# Patient Record
Sex: Female | Born: 1975 | Race: White | Hispanic: No | Marital: Married | Smoking: Never smoker
Health system: Southern US, Community
[De-identification: ages and names within clinical notes are randomized; demographics above are authoritative.]

## PROBLEM LIST (undated history)

## (undated) DIAGNOSIS — K5900 Constipation, unspecified: Secondary | ICD-10-CM

## (undated) HISTORY — PX: APPENDECTOMY: SHX54

## (undated) HISTORY — PX: STAPEDECTOMY: SHX2435

## (undated) HISTORY — PX: BREAST REDUCTION SURGERY: SHX8

---

## 1999-11-29 HISTORY — PX: REDUCTION MAMMAPLASTY: SUR839

## 2015-12-27 ENCOUNTER — Ambulatory Visit
Admission: EM | Admit: 2015-12-27 | Discharge: 2015-12-27 | Disposition: A | Discharge status: Home / Self Care | Payer: No Typology Code available for payment source | Attending: Family Medicine | Admitting: Family Medicine

## 2015-12-27 DIAGNOSIS — N390 Urinary tract infection, site not specified: Secondary | ICD-10-CM

## 2015-12-27 HISTORY — DX: Constipation, unspecified: K59.00

## 2015-12-27 LAB — URINALYSIS COMPLETE WITH MICROSCOPIC (ARMC ONLY)

## 2015-12-27 MED ORDER — CIPROFLOXACIN HCL 500 MG PO TABS: 500.0000 mg | ORAL_TABLET | Freq: Two times a day (BID) | ORAL | Status: AC

## 2015-12-27 MED ORDER — CIPROFLOXACIN HCL 500 MG PO TABS: 500.0000 mg | ORAL_TABLET | Freq: Two times a day (BID) | ORAL | Status: DC

## 2015-12-27 NOTE — ED Provider Notes (Signed)
CSN: [id number]     Arrival date & time 12/27/15  1131 History   First MD Initiated Contact with Patient 12/27/15 1247    Nurses notes were reviewed. Chief Complaint  Patient presents with  . Urinary Tract Infection   Patient reports started have frequency last night. The frequency has continued. She reports some burning but mostly just almost irritation because she is unable to urinate even though she finds she has to. She states then passed from the burning will occur. She has Azo from the last UTI was she's been using which is interfering with her urinary test. She states14 days of antibiotics to treat her UTIs. She denies use infections denies any dyspareunia or vaginal discharge. No abdominal pain and just recently some right flank pain started to develop.   (Consider location/radiation/quality/duration/timing/severity/associated sxs/prior Treatment) Patient is a 40 y.o. female presenting with urinary tract infection. The history is provided by the patient.  Urinary Tract Infection Pain quality:  Burning Pain severity:  Mild Onset quality:  Sudden Chronicity:  New Recent urinary tract infections: no   Relieved by:  Phenazopyridine Ineffective treatments:  Phenazopyridine Urinary symptoms: frequent urination   Urinary symptoms: no hematuria and no bladder incontinence   Associated symptoms: flank pain   Associated symptoms: no abdominal pain, no fever, no genital lesions, no vaginal discharge and no vomiting     Past Medical History  Diagnosis Date  . Constipation    Past Surgical History  Procedure Laterality Date  . Appendectomy    . Breast reduction surgery    . Stapedectomy     No family history on file. Social History  Substance Use Topics  . Smoking status: Never Smoker   . Smokeless tobacco: None  . Alcohol Use: Yes     Comment: socially   OB History    No data available     Review of Systems  Constitutional: Negative for fever.  Gastrointestinal: Negative  for vomiting and abdominal pain.  Genitourinary: Positive for flank pain. Negative for vaginal discharge.  All other systems reviewed and are negative.   Allergies  Sulfa antibiotics and Adhesive  Home Medications   Prior to Admission medications   Medication Sig Start Date End Date Taking? Authorizing Provider  GARCINIA CAMBOGIA-CHROMIUM PO Take by mouth.   Yes Historical Provider, MD  Oxycodone-Acetaminophen (LYNOX PO) Take by mouth.   Yes Historical Provider, MD  ciprofloxacin (CIPRO) 500 MG tablet Take 1 tablet (500 mg total) by mouth 2 (two) times daily. 12/27/15   Frederich Cha, MD   Meds Ordered and Administered this Visit  Medications - No data to display  BP 134/89 mmHg  Pulse 86  Temp(Src) 96.5 F (35.8 C) (Tympanic)  Resp 16  Ht 5' 4"  (1.626 m)  Wt 192 lb (87.091 kg)  BMI 32.94 kg/m2  SpO2 100% No data found.   Physical Exam  Constitutional: She is oriented to person, place, and time. She appears well-developed.  HENT:  Head: Normocephalic and atraumatic.  Eyes: Conjunctivae are normal. Pupils are equal, round, and reactive to light.  Neck: Normal range of motion.  Abdominal: Soft. Bowel sounds are normal. There is no tenderness.  Musculoskeletal: Normal range of motion.  Neurological: She is alert and oriented to person, place, and time.  Skin: Skin is warm and dry.  Psychiatric: She has a normal mood and affect.  Vitals reviewed.   ED Course  Procedures (including critical care time)  Labs Review Labs Reviewed  URINALYSIS COMPLETEWITH MICROSCOPIC [facility]  ONLY) - Abnormal; Notable for the following:    Color, Urine ORANGE (*)    APPearance HAZY (*)    Glucose, UA   (*)    Value: TEST NOT REPORTED DUE TO COLOR INTERFERENCE OF URINE PIGMENT   Bilirubin Urine   (*)    Value: TEST NOT REPORTED DUE TO COLOR INTERFERENCE OF URINE PIGMENT   Ketones, ur   (*)    Value: TEST NOT REPORTED DUE TO COLOR INTERFERENCE OF URINE PIGMENT   Hgb urine dipstick   (*)     Value: TEST NOT REPORTED DUE TO COLOR INTERFERENCE OF URINE PIGMENT   Protein, ur   (*)    Value: TEST NOT REPORTED DUE TO COLOR INTERFERENCE OF URINE PIGMENT   Nitrite   (*)    Value: TEST NOT REPORTED DUE TO COLOR INTERFERENCE OF URINE PIGMENT   Leukocytes, UA   (*)    Value: TEST NOT REPORTED DUE TO COLOR INTERFERENCE OF URINE PIGMENT   Bacteria, UA RARE (*)    Squamous Epithelial / LPF 0-5 (*)    All other components within normal limits  URINE CULTURE    Imaging Review No results found.   Visual Acuity Review  Right Eye Distance:   Left Eye Distance:   Bilateral Distance:    Right Eye Near:   Left Eye Near:    Bilateral Near:     Results for orders placed or performed during the hospital encounter of 12/27/15  Urinalysis complete, with microscopic  Result Value Ref Range   Color, Urine ORANGE (A) YELLOW   APPearance HAZY (A) CLEAR   Glucose, UA (A) NEGATIVE mg/dL    TEST NOT REPORTED DUE TO COLOR INTERFERENCE OF URINE PIGMENT   Bilirubin Urine (A) NEGATIVE    TEST NOT REPORTED DUE TO COLOR INTERFERENCE OF URINE PIGMENT   Ketones, ur (A) NEGATIVE mg/dL    TEST NOT REPORTED DUE TO COLOR INTERFERENCE OF URINE PIGMENT   Specific Gravity, Urine  1.005 - 1.030    TEST NOT REPORTED DUE TO COLOR INTERFERENCE OF URINE PIGMENT   Hgb urine dipstick (A) NEGATIVE    TEST NOT REPORTED DUE TO COLOR INTERFERENCE OF URINE PIGMENT   pH  5.0 - 8.0    TEST NOT REPORTED DUE TO COLOR INTERFERENCE OF URINE PIGMENT   Protein, ur (A) NEGATIVE mg/dL    TEST NOT REPORTED DUE TO COLOR INTERFERENCE OF URINE PIGMENT   Nitrite (A) NEGATIVE    TEST NOT REPORTED DUE TO COLOR INTERFERENCE OF URINE PIGMENT   Leukocytes, UA (A) NEGATIVE    TEST NOT REPORTED DUE TO COLOR INTERFERENCE OF URINE PIGMENT   RBC / HPF 6-30 0 - 5 RBC/hpf   WBC, UA TOO NUMEROUS TO COUNT 0 - 5 WBC/hpf   Bacteria, UA RARE (A) NONE SEEN   Squamous Epithelial / LPF 0-5 (A) NONE SEEN     MDM   1. UTI (lower urinary  tract infection)      We'll place patient on Cipro 500 mg 1 tablet twice a day for week. She states she has planned Azo to use she does not use any Pyridium. She also reports no need for Diflucan she does not get yeast infections. She did mention that she only takes Cipro for 2 weeks to completely eradicate the UTI not comfortable doing that right now especially since being so it. With the urinalysis. I suggest she call on Tuesday afternoon if the urine culture seen to be effective  or not. Will give a work note for today.  Note: This dictation was prepared with Dragon dictation along with smaller phrase technology. Any transcriptional errors that result from this process are unintentional.     Frederich Cha, MD 12/27/15 [phone]

## 2015-12-27 NOTE — Discharge Instructions (Signed)
Urinary Tract Infection A urinary tract infection (UTI) can occur any place along the urinary tract. The tract includes the kidneys, ureters, bladder, and urethra. A type of germ called bacteria often causes a UTI. UTIs are often helped with antibiotic medicine.  HOME CARE   If given, take antibiotics as told by your doctor. Finish them even if you start to feel better.  Drink enough fluids to keep your pee (urine) clear or pale yellow.  Avoid tea, drinks with caffeine, and bubbly (carbonated) drinks.  Pee often. Avoid holding your pee in for a long time.  Pee before and after having sex (intercourse).  Wipe from front to back after you poop (bowel movement) if you are a woman. Use each tissue only once. GET HELP RIGHT AWAY IF:   You have back pain.  You have lower belly (abdominal) pain.  You have chills.  You feel sick to your stomach (nauseous).  You throw up (vomit).  Your burning or discomfort with peeing does not go away.  You have a fever.  Your symptoms are not better in 3 days. MAKE SURE YOU:   Understand these instructions.  Will watch your condition.  Will get help right away if you are not doing well or get worse.   This information is not intended to replace advice given to you by your health care provider. Make sure you discuss any questions you have with your health care provider.   Document Released: 05/02/2008 Document Revised: 12/05/2014 Document Reviewed: 06/14/2012 Elsevier Interactive Patient Education [employer].

## 2015-12-27 NOTE — ED Notes (Signed)
Hx UTI's. Started last evening with urinary urgency. C/o low back pain and frequency and occasional burning

## 2015-12-31 ENCOUNTER — Telehealth: Payer: Self-pay | Admitting: *Deleted

## 2015-12-31 LAB — URINE CULTURE
Culture: 20000
Special Requests: NORMAL

## 2015-12-31 NOTE — ED Notes (Signed)
Called patient and informed her that her urine culture came back positive for bacteria. Patient reports improvement of symptoms since she started taking Cipro. I informed patient to seek medical treatment if the present course of antibiotics do not resolve her symptoms completely.

## 2016-03-07 ENCOUNTER — Other Ambulatory Visit: Payer: Self-pay | Admitting: Obstetrics and Gynecology

## 2016-03-07 DIAGNOSIS — Z1231 Encounter for screening mammogram for malignant neoplasm of breast: Secondary | ICD-10-CM

## 2016-03-09 ENCOUNTER — Ambulatory Visit
Admission: RE | Admit: 2016-03-09 | Discharge: 2016-03-09 | Disposition: A | Discharge status: Home / Self Care | Payer: No Typology Code available for payment source | Source: Ambulatory Visit | Attending: Obstetrics and Gynecology | Admitting: Obstetrics and Gynecology

## 2016-03-09 DIAGNOSIS — Z1231 Encounter for screening mammogram for malignant neoplasm of breast: Secondary | ICD-10-CM | POA: Diagnosis not present

## 2016-03-15 ENCOUNTER — Other Ambulatory Visit: Payer: Self-pay | Admitting: Obstetrics and Gynecology

## 2016-03-15 DIAGNOSIS — R928 Other abnormal and inconclusive findings on diagnostic imaging of breast: Secondary | ICD-10-CM

## 2016-03-18 ENCOUNTER — Ambulatory Visit
Admission: RE | Admit: 2016-03-18 | Discharge: 2016-03-18 | Disposition: A | Discharge status: Home / Self Care | Payer: No Typology Code available for payment source | Source: Ambulatory Visit | Attending: Obstetrics and Gynecology | Admitting: Obstetrics and Gynecology

## 2016-03-18 DIAGNOSIS — Z9889 Other specified postprocedural states: Secondary | ICD-10-CM | POA: Insufficient documentation

## 2016-03-18 DIAGNOSIS — R928 Other abnormal and inconclusive findings on diagnostic imaging of breast: Secondary | ICD-10-CM

## 2016-03-18 DIAGNOSIS — N63 Unspecified lump in breast: Secondary | ICD-10-CM | POA: Insufficient documentation

## 2016-08-31 ENCOUNTER — Other Ambulatory Visit: Payer: Self-pay | Admitting: Obstetrics and Gynecology

## 2016-09-21 ENCOUNTER — Other Ambulatory Visit: Payer: Self-pay | Admitting: Obstetrics and Gynecology

## 2016-09-21 DIAGNOSIS — N6310 Unspecified lump in the right breast, unspecified quadrant: Secondary | ICD-10-CM

## 2016-09-22 ENCOUNTER — Ambulatory Visit
Admission: RE | Admit: 2016-09-22 | Discharge: 2016-09-22 | Disposition: A | Discharge status: Home / Self Care | Payer: No Typology Code available for payment source | Source: Ambulatory Visit | Attending: Obstetrics and Gynecology | Admitting: Obstetrics and Gynecology

## 2016-09-22 DIAGNOSIS — N6310 Unspecified lump in the right breast, unspecified quadrant: Secondary | ICD-10-CM | POA: Insufficient documentation

## 2017-03-28 ENCOUNTER — Other Ambulatory Visit: Payer: Self-pay | Admitting: Obstetrics and Gynecology

## 2017-03-28 DIAGNOSIS — Z1231 Encounter for screening mammogram for malignant neoplasm of breast: Secondary | ICD-10-CM

## 2017-04-04 ENCOUNTER — Other Ambulatory Visit: Payer: Self-pay | Admitting: Obstetrics and Gynecology

## 2017-04-04 DIAGNOSIS — Z1231 Encounter for screening mammogram for malignant neoplasm of breast: Secondary | ICD-10-CM

## 2017-04-25 ENCOUNTER — Ambulatory Visit
Admission: RE | Admit: 2017-04-25 | Discharge: 2017-04-25 | Disposition: A | Discharge status: Home / Self Care | Payer: No Typology Code available for payment source | Source: Ambulatory Visit | Attending: Obstetrics and Gynecology | Admitting: Obstetrics and Gynecology

## 2017-04-25 DIAGNOSIS — N63 Unspecified lump in unspecified breast: Secondary | ICD-10-CM | POA: Diagnosis present

## 2017-04-25 DIAGNOSIS — N6311 Unspecified lump in the right breast, upper outer quadrant: Secondary | ICD-10-CM | POA: Insufficient documentation

## 2017-04-25 DIAGNOSIS — Z1231 Encounter for screening mammogram for malignant neoplasm of breast: Secondary | ICD-10-CM

## 2017-05-27 IMAGING — MG MM DIGITAL SCREENING BILAT W/ CAD
6 series · 6 of 6 positions shown · non-contrast
Comparison: None.

CLINICAL DATA: Screening. Baseline exam.

EXAM:
DIGITAL SCREENING BILATERAL MAMMOGRAM WITH CAD

[R CC (1 of 3)]
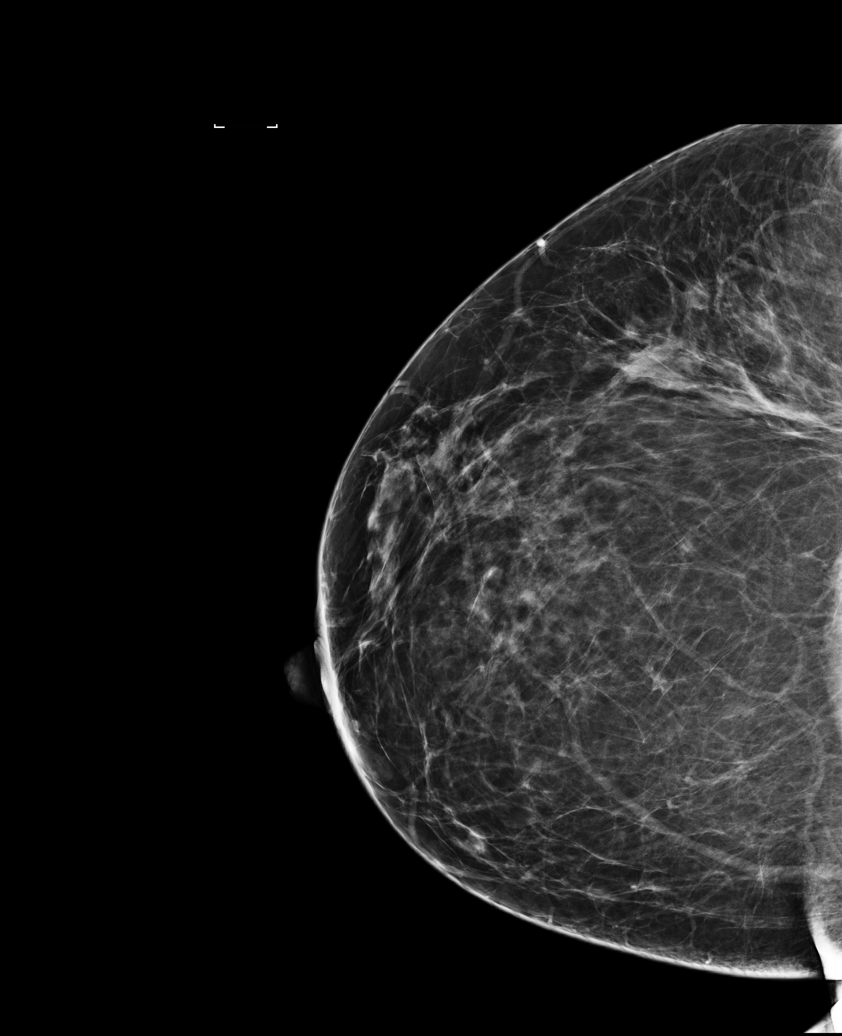

[R CC (2 of 3)]
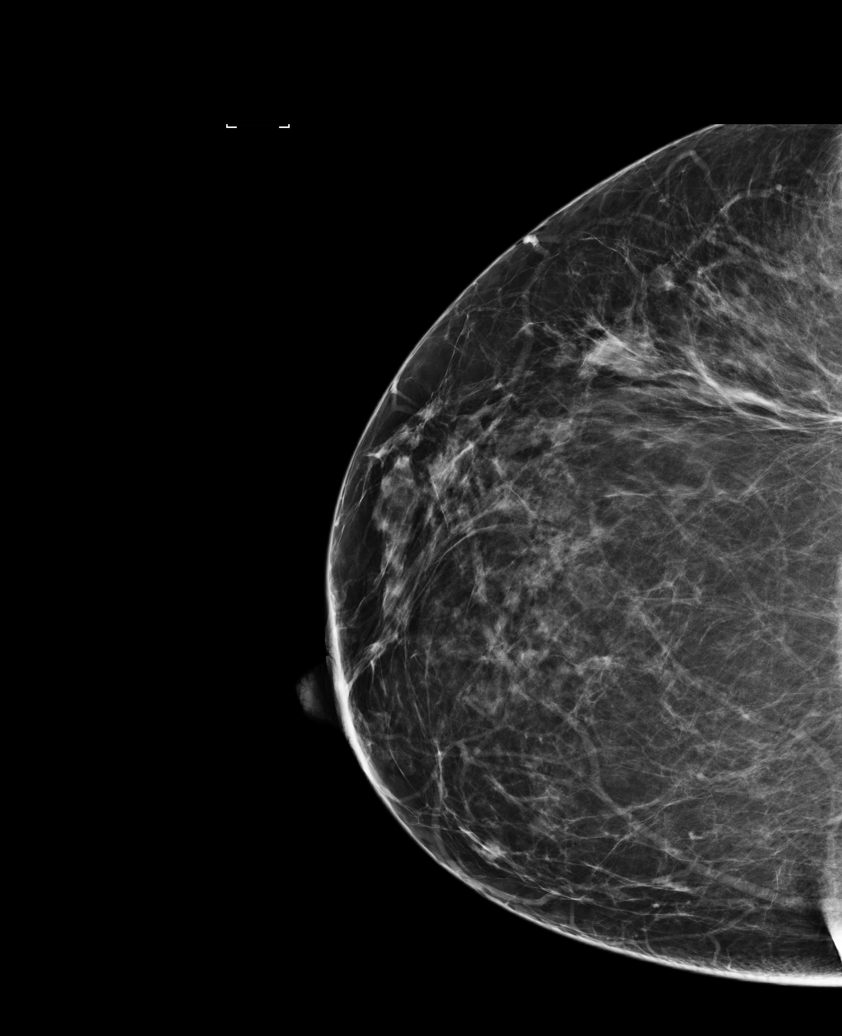

[L CC]
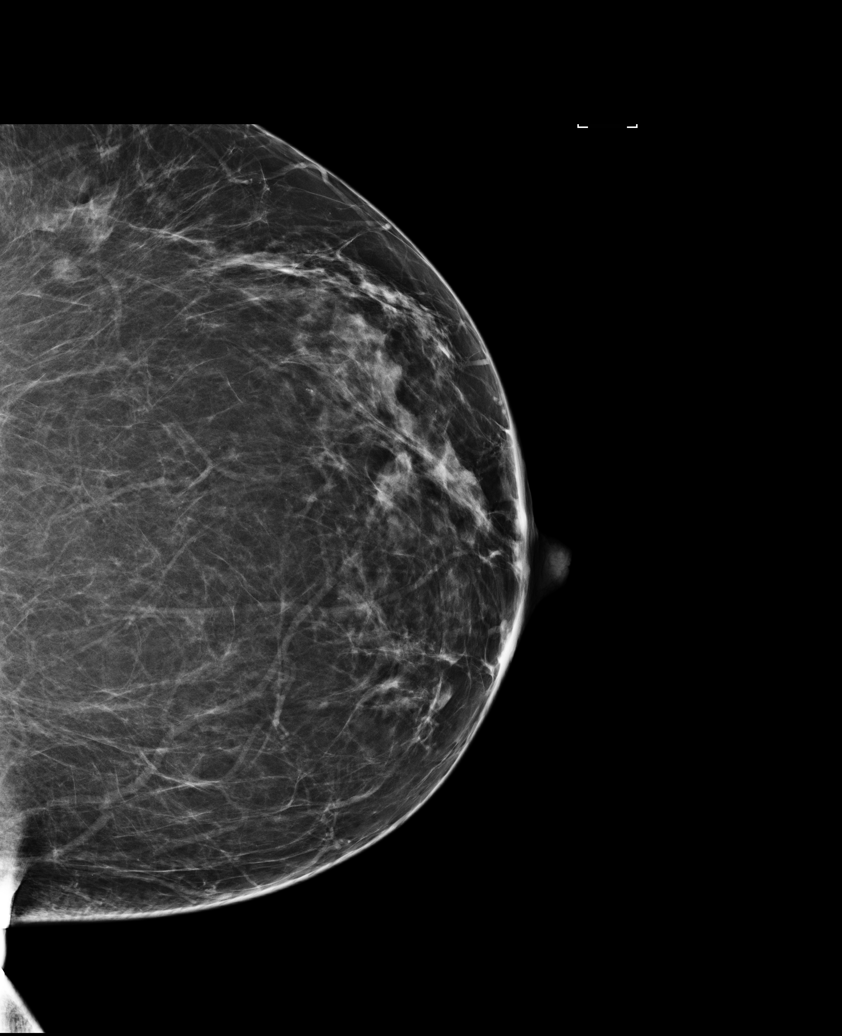

[L MLO]
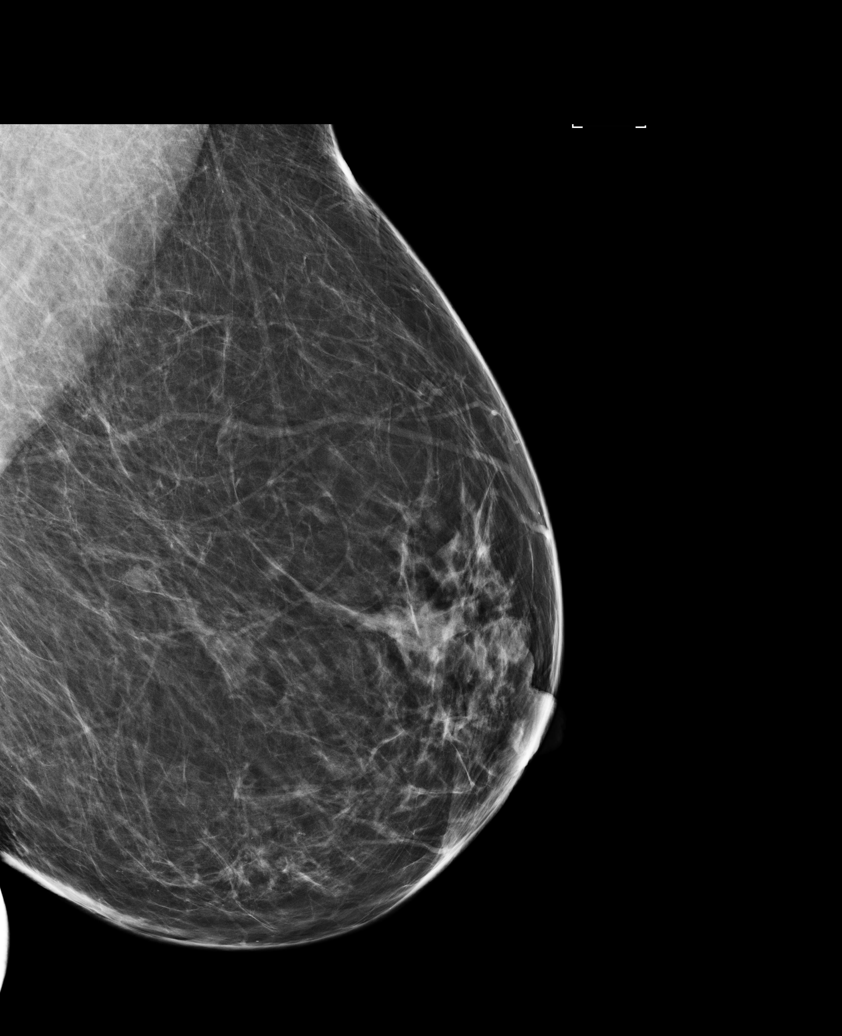

[R MLO]
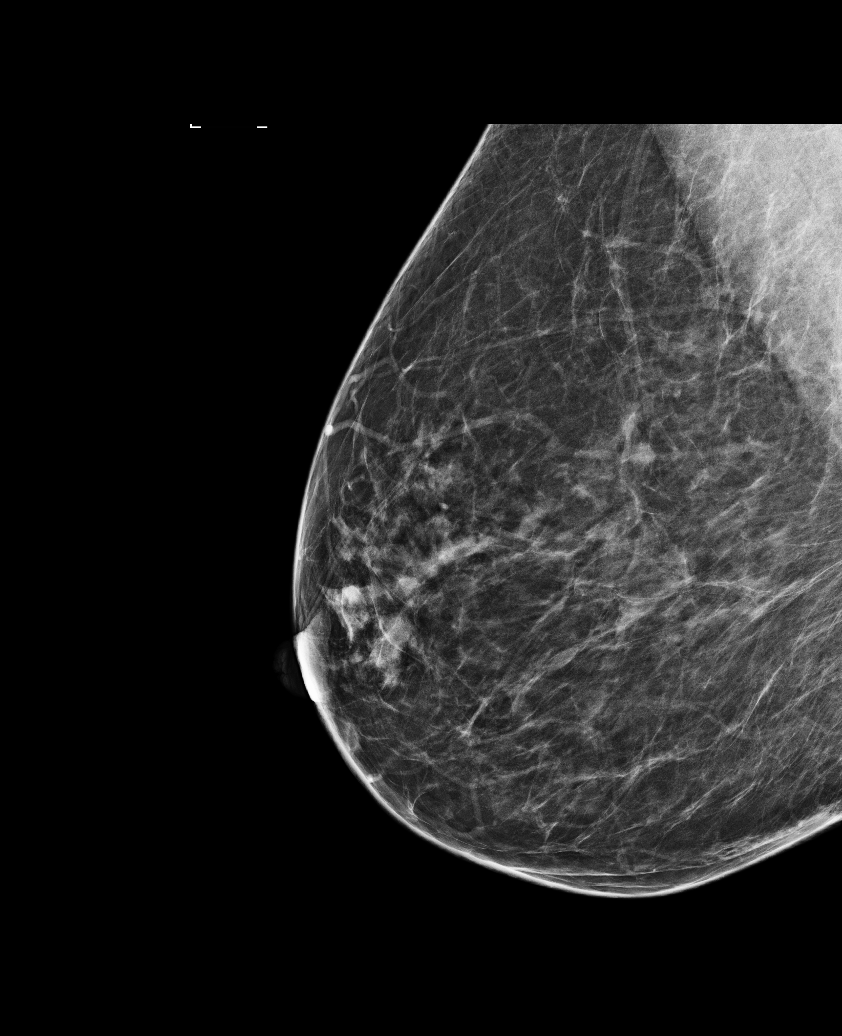

[R CC (3 of 3)]
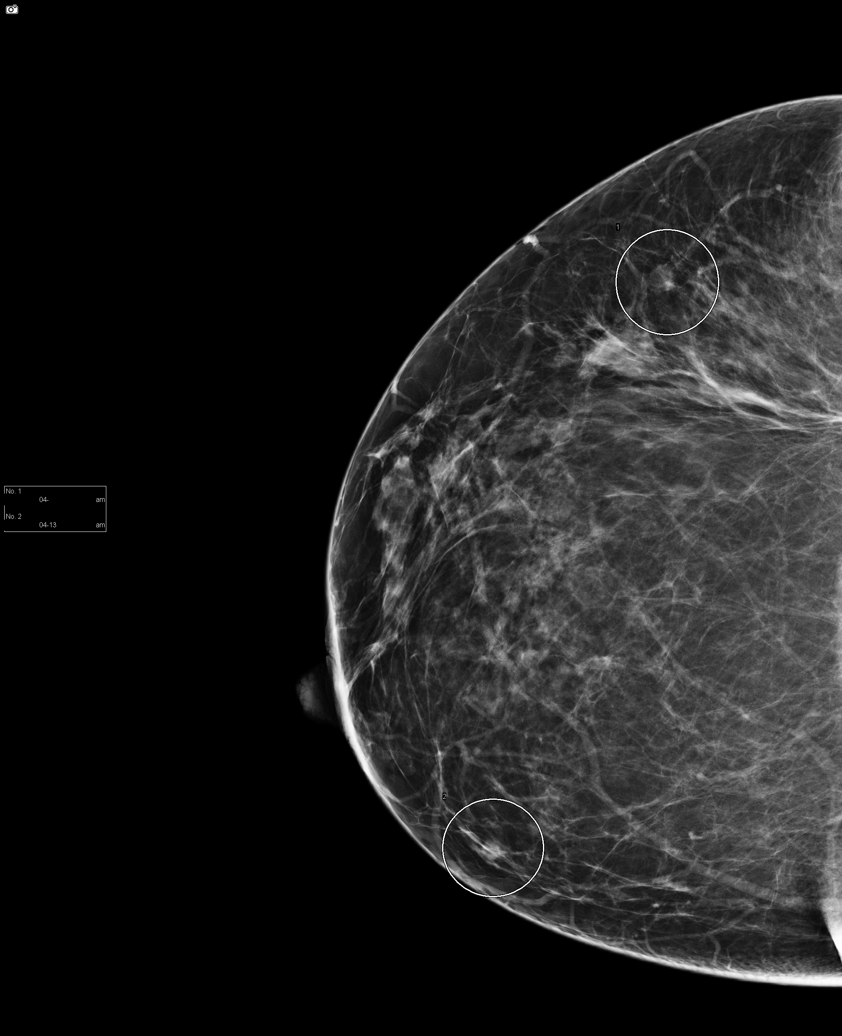

[6 of 6 positions shown; findings below may reference images not displayed]

ACR Breast Density Category b: There are scattered areas of
fibroglandular density.
FINDINGS: In the right breast possible masses require further evaluation.

In the left breast possible mass requires further evaluation.

Images were processed with CAD.
IMPRESSION: Further evaluation is suggested for possible masses in the right
breast.

Further evaluation is suggested for possible mass in the left
breast.

RECOMMENDATION:
Diagnostic mammogram and possibly ultrasound of both breasts.
(Code:X6-A-HHP)

The patient will be contacted regarding the findings, and additional
imaging will be scheduled.

BI-RADS CATEGORY  0: Incomplete. Need additional imaging evaluation
and/or prior mammograms for comparison.

## 2017-09-06 ENCOUNTER — Ambulatory Visit: Payer: Self-pay | Admitting: Physician Assistant

## 2017-09-06 ENCOUNTER — Encounter: Payer: Self-pay | Admitting: Physician Assistant

## 2017-09-06 VITALS — BP 120/80 | HR 84 | Temp 98.7°F

## 2017-09-06 DIAGNOSIS — L03114 Cellulitis of left upper limb: Secondary | ICD-10-CM

## 2017-09-06 DIAGNOSIS — W57XXXA Bitten or stung by nonvenomous insect and other nonvenomous arthropods, initial encounter: Secondary | ICD-10-CM

## 2017-09-06 MED ORDER — CLINDAMYCIN HCL 150 MG PO CAPS: 150.0000 mg | ORAL_CAPSULE | Freq: Three times a day (TID) | ORAL | 0 refills | Status: AC

## 2017-09-06 NOTE — Progress Notes (Signed)
S: c/o redness and swelling on left forearm near her elbow, looked like a bug bite yesterday but today the redness has spread and the skin is really warm, no fever/chills, no drainage from the site, used otc antibiotic ointment without any relief  O: vitals wnl, nad, skin with 5cent sized red area with smaller pea sized red area in center, no pustules or drainage, n/v intact  A: cellulitis, bug bite  P: clindamycin 113m tid x 7d

## 2017-10-02 ENCOUNTER — Ambulatory Visit: Payer: Self-pay | Admitting: Physician Assistant

## 2017-10-02 ENCOUNTER — Encounter: Payer: Self-pay | Admitting: Physician Assistant

## 2017-10-02 VITALS — BP 120/80 | HR 85 | Temp 98.5°F

## 2017-10-02 DIAGNOSIS — J069 Acute upper respiratory infection, unspecified: Secondary | ICD-10-CM

## 2017-10-02 MED ORDER — AMOXICILLIN 875 MG PO TABS: 875.0000 mg | ORAL_TABLET | Freq: Two times a day (BID) | ORAL | 0 refills | Status: AC

## 2017-10-02 MED ORDER — BENZONATATE 200 MG PO CAPS: 200.0000 mg | ORAL_CAPSULE | Freq: Three times a day (TID) | ORAL | 0 refills | Status: AC | PRN

## 2017-10-02 NOTE — Progress Notes (Signed)
S: C/o sinus press, sore throat, cough and congestion for 7 days, no fever, chills, cp/sob, v/d; mucus was green this am but clear throughout the day, cough is sporadic,   Using otc meds: mucinex, dayquil/nyquil  O: PE: vitals wnl, nad,  perrl eomi, normocephalic, tms dull, nasal mucosa red and swollen, throat injected, neck supple no lymph, lungs c t a, cv rrr, neuro intact  A:  Acute  uri   P: drink fluids, continue regular meds , use otc meds of choice, return if not improving in 5 days, return earlier if worsening , amoxil, tessalon perls

## 2018-03-15 ENCOUNTER — Other Ambulatory Visit: Payer: Self-pay | Admitting: Obstetrics and Gynecology

## 2018-03-15 DIAGNOSIS — N63 Unspecified lump in unspecified breast: Secondary | ICD-10-CM

## 2018-04-26 ENCOUNTER — Other Ambulatory Visit: Payer: Self-pay | Admitting: Obstetrics and Gynecology

## 2018-04-26 ENCOUNTER — Ambulatory Visit
Admission: RE | Admit: 2018-04-26 | Discharge: 2018-04-26 | Disposition: A | Discharge status: Home / Self Care | Payer: No Typology Code available for payment source | Source: Ambulatory Visit | Attending: Obstetrics and Gynecology | Admitting: Obstetrics and Gynecology

## 2018-04-26 DIAGNOSIS — N63 Unspecified lump in unspecified breast: Secondary | ICD-10-CM | POA: Insufficient documentation

## 2018-11-06 ENCOUNTER — Other Ambulatory Visit: Payer: Self-pay | Admitting: Internal Medicine

## 2019-05-27 ENCOUNTER — Other Ambulatory Visit: Payer: Self-pay | Admitting: Obstetrics and Gynecology

## 2019-05-27 DIAGNOSIS — Z1231 Encounter for screening mammogram for malignant neoplasm of breast: Secondary | ICD-10-CM

## 2019-07-03 ENCOUNTER — Ambulatory Visit
Admission: RE | Admit: 2019-07-03 | Discharge: 2019-07-03 | Disposition: A | Discharge status: Home / Self Care | Payer: No Typology Code available for payment source | Source: Ambulatory Visit | Attending: Obstetrics and Gynecology | Admitting: Obstetrics and Gynecology

## 2019-07-03 DIAGNOSIS — Z1231 Encounter for screening mammogram for malignant neoplasm of breast: Secondary | ICD-10-CM | POA: Diagnosis not present

## 2020-07-08 ENCOUNTER — Other Ambulatory Visit: Payer: Self-pay | Admitting: Obstetrics and Gynecology

## 2020-07-08 DIAGNOSIS — Z1231 Encounter for screening mammogram for malignant neoplasm of breast: Secondary | ICD-10-CM

## 2020-07-27 ENCOUNTER — Other Ambulatory Visit: Payer: Self-pay

## 2020-07-27 ENCOUNTER — Ambulatory Visit
Admission: RE | Admit: 2020-07-27 | Discharge: 2020-07-27 | Disposition: A | Discharge status: Home / Self Care | Payer: No Typology Code available for payment source | Source: Ambulatory Visit | Attending: Obstetrics and Gynecology | Admitting: Obstetrics and Gynecology

## 2020-07-27 DIAGNOSIS — Z1231 Encounter for screening mammogram for malignant neoplasm of breast: Secondary | ICD-10-CM | POA: Insufficient documentation

## 2020-08-06 ENCOUNTER — Other Ambulatory Visit: Payer: Self-pay

## 2020-08-06 ENCOUNTER — Ambulatory Visit (INDEPENDENT_AMBULATORY_CARE_PROVIDER_SITE_OTHER): Payer: No Typology Code available for payment source | Admitting: Dermatology

## 2020-08-06 DIAGNOSIS — D225 Melanocytic nevi of trunk: Secondary | ICD-10-CM | POA: Diagnosis not present

## 2020-08-06 DIAGNOSIS — L820 Inflamed seborrheic keratosis: Secondary | ICD-10-CM | POA: Diagnosis not present

## 2020-08-06 DIAGNOSIS — L918 Other hypertrophic disorders of the skin: Secondary | ICD-10-CM | POA: Diagnosis not present

## 2020-08-06 DIAGNOSIS — L821 Other seborrheic keratosis: Secondary | ICD-10-CM

## 2020-08-06 DIAGNOSIS — D229 Melanocytic nevi, unspecified: Secondary | ICD-10-CM

## 2020-08-06 DIAGNOSIS — L578 Other skin changes due to chronic exposure to nonionizing radiation: Secondary | ICD-10-CM

## 2020-08-06 NOTE — Progress Notes (Signed)
   New Patient Visit  Subjective  Jaime Lynch is a 44 y.o. female who presents for the following: other (pt states that she wants some spots on her face, a skin tag under her R arm, and a spot on her back looked at. ).  Objective  Well appearing patient in no apparent distress; mood and affect are within normal limits.  A focused examination was performed including face, back, R armpit. Relevant physical exam findings are noted in the Assessment and Plan.  Face  Images    Objective  Right Axilla: Fleshy, skin-colored pedunculated papules.    Objective  L mid back braline: Brown papule 0.5 cm  Objective  face: Stuck-on verrucous, tan-brown papules and plaques. --Discussed benign etiology and prognosis   Assessment & Plan  Inflamed seborrheic keratosis (3) Face  Destruction of lesion - Face Complexity: simple   Destruction method: cryotherapy   Informed consent: discussed and consent obtained   Timeout:  patient name, date of birth, surgical site, and procedure verified Lesion destroyed using liquid nitrogen: Yes   Region frozen until ice ball extended beyond lesion: Yes   Outcome: patient tolerated procedure well with no complications   Post-procedure details: wound care instructions given    Skin tag Right Axilla  Benign, observe.    Advised pt that if not covered by insurance to remove, there is a $115 out of pocket fee.   Irritated nevus L mid back braline  Skin / nail biopsy - L mid back braline Type of biopsy: tangential   Informed consent: discussed and consent obtained   Timeout: patient name, date of birth, surgical site, and procedure verified   Procedure prep:  Patient was prepped and draped in usual sterile fashion Prep type:  Isopropyl alcohol Anesthesia: the lesion was anesthetized in a standard fashion   Anesthetic:  1% lidocaine w/ epinephrine 1-100,000 buffered w/ 8.4% NaHCO3 Instrument used: flexible razor blade   Hemostasis  achieved with: pressure, aluminum chloride and electrodesiccation   Outcome: patient tolerated procedure well   Post-procedure details: sterile dressing applied and wound care instructions given   Dressing type: bandage and petrolatum    Specimen 1 - Surgical pathology Differential Diagnosis: irritated nevus, rule out dysplasia Check Margins: No Brown papule 0.5 cm  Seborrheic keratosis face  Advised pt that the spots are harmless and can get larger over time.   Discussed that if they are growing and changing that it would typically be covered by insurance to have removed. Any removed for cosmetic reasons would be $60 for one spot and $15 for each additional per visit.   Pt wants to hold off on removing any until next visit.  Actinic Damage - diffuse scaly erythematous macules with underlying dyspigmentation - Recommend daily broad spectrum sunscreen SPF 30+ to sun-exposed areas, reapply every 2 hours as needed.  - Call for new or changing lesions.  Return in about 4 months (around 12/06/2020) for SK follow up.   IHarriett Sine, CMA, am acting as scribe for Sarina Ser, MD.  Documentation: I have reviewed the above documentation for accuracy and completeness, and I agree with the above.  Sarina Ser, MD

## 2020-08-06 NOTE — Patient Instructions (Addendum)
Wound Care Instructions  1. Cleanse wound gently with soap and water once a day then pat dry with clean gauze. Apply a thing coat of Petrolatum (petroleum jelly, "Vaseline") over the wound (unless you have an allergy to this). We recommend that you use a new, sterile tube of Vaseline. Do not pick or remove scabs. Do not remove the yellow or white "healing tissue" from the base of the wound.  2. Cover the wound with fresh, clean, nonstick gauze and secure with paper tape. You may use Band-Aids in place of gauze and tape if the would is small enough, but would recommend trimming much of the tape off as there is often too much. Sometimes Band-Aids can irritate the skin.  3. You should call the office for your biopsy report after 1 week if you have not already been contacted.  4. If you experience any problems, such as abnormal amounts of bleeding, swelling, significant bruising, significant pain, or evidence of infection, please call the office immediately.  5. FOR ADULT SURGERY PATIENTS: If you need something for pain relief you may take 1 extra strength Tylenol (acetaminophen) AND 2 Ibuprofen (228m each) together every 4 hours as needed for pain. (do not take these if you are allergic to them or if you have a reason you should not take them.) Typically, you may only need pain medication for 1 to 3 days.    Seborrheic Keratosis  What causes seborrheic keratoses? Seborrheic keratoses are harmless, common skin growths that first appear during adult life.  As time goes by, more growths appear.  Some people may develop a large number of them.  Seborrheic keratoses appear on both covered and uncovered body parts.  They are not caused by sunlight.  The tendency to develop seborrheic keratoses can be inherited.  They vary in color from skin-colored to gray, brown, or even black.  They can be either smooth or have a rough, warty surface.   Seborrheic keratoses are superficial and look as if they were stuck  on the skin.  Under the microscope this type of keratosis looks like layers upon layers of skin.  That is why at times the top layer may seem to fall off, but the rest of the growth remains and re-grows.    Treatment Seborrheic keratoses do not need to be treated, but can easily be removed in the office.  Seborrheic keratoses often cause symptoms when they rub on clothing or jewelry.  Lesions can be in the way of shaving.  If they become inflamed, they can cause itching, soreness, or burning.  Removal of a seborrheic keratosis can be accomplished by freezing, burning, or surgery. If any spot bleeds, scabs, or grows rapidly, please return to have it checked, as these can be an indication of a skin cancer.    Skin Tag, Adult  A skin tag (acrochordon) is a soft, extra growth of skin. Most skin tags are flesh-colored and rarely bigger than a pencil eraser. They commonly form near areas where there are folds in the skin, such as the armpit or groin. Skin tags are not dangerous, and they do not spread from person to person (are not contagious). You may have one skin tag or several. Skin tags do not require treatment. However, your health care provider may recommend removal of a skin tag if it:  Gets irritated from clothing.  Bleeds.  Is visible and unsightly. Your health care provider can remove skin tags with a simple surgical procedure or a  procedure that involves freezing the skin tag. Follow these instructions at home:  Watch for any changes in your skin tag. A normal skin tag does not require any other special care at home.  Take over-the-counter and prescription medicines only as told by your health care provider.  Keep all follow-up visits as told by your health care provider. This is important. Contact a health care provider if:  You have a skin tag that: ? Becomes painful. ? Changes color. ? Bleeds. ? Swells.  You develop more skin tags. This information is not intended to  replace advice given to you by your health care provider. Make sure you discuss any questions you have with your health care provider. Document Revised: 10/27/2017 Document Reviewed: 11/29/2015 Elsevier Patient Education  2020 [employer].

## 2020-08-07 ENCOUNTER — Encounter: Payer: Self-pay | Admitting: Dermatology

## 2020-08-10 ENCOUNTER — Telehealth: Payer: Self-pay

## 2020-08-10 NOTE — Telephone Encounter (Signed)
Patient called and informed of biopsy results, patient verbalized understanding.

## 2020-09-17 ENCOUNTER — Ambulatory Visit: Payer: No Typology Code available for payment source | Attending: Obstetrics and Gynecology

## 2020-09-17 ENCOUNTER — Other Ambulatory Visit: Payer: Self-pay

## 2020-09-17 DIAGNOSIS — M545 Low back pain, unspecified: Secondary | ICD-10-CM | POA: Insufficient documentation

## 2020-09-17 NOTE — Therapy (Signed)
[place] MAIN [facility] SERVICES [address] [place], [place], [address] Phone: [phone]   Fax:  [phone]  Patient Details  Name: Jaime Lynch MRN: [id number] Date of Birth: 03/05/1976 Referring Provider:  Regino Bellow, MD  Encounter Date: 09/17/2020  PT/OT/SLP Screening Form   Time: in__13:00   Time out_13:19   Complaint : back pain  Past Medical Hx: no history of back pain per patient, given birth Injury Date: Initially began slowly ~ a few weeks ago; worsened Monday 10/18  Pain Scale: on Monday was excruciating, able to tolerate better now Patient's phone number:   Hx (this occurrence):   Patient presents from work, worsening back pain began a few weeks ago; worsened Monday 10/18 to the point where she could not get up, had start of shooting pain down L leg, Twisting and extending her spine to the left is severely painful. 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 Has not been able to perform her daily yoga per her baseline.     Assessment:  Palpation: muscle guarding limited muscle tissue length of thoracic and lumbar paraspinals  Rule out: FABER, Scour, FAIR -for pain only positive for stretch +SLR LLE but pain in pelvis and muscle region of spine  Posterior pelvic tilt: no change to pain  Motion: Trunk Flexion  L hip elevated ; able to perform full range with rotation to R   Trunk Extension Painful to extend, rotates towards the R  Trunk R SB >25% limitation due to guarding  Trunk L SB >25% limitation due to guarding, painful  Trunk R rotation >25% limitation  Trunk L rotation >25% limitation   Palpation: tender to L quadratus and ridge of pelvis   Accessory motions: L posterior rotation mobilization with depression distraction 8x 20 second holds grade II-III reduces pain.     Recommendations:    Comments: Patient presents with excessive thoracic and lumbar paraspinal guarding with  exacerbated L quadratus. Assessment revealed leg length discrepancy as a result of anterior pelvic tilt and elevated pelvis on L side. Mobilization to L pelvis into inferior rotation and depression to neutralize alignment reduced pain. Patient educated on rotation of pelvis, need for soft tissue work, and nature of high risk for regression back out of alignment. Recommending PT referral, if not able to do PT due to scheduling/affordability would at very least benefit from massage to reduce tension of paraspinals. Patient agreeable.    []  Patient would benefit from an MD referral [x]  Patient would benefit from a full PT/OT/ SLP evaluation and treatment. []  No intervention recommended at this time.  Janna Arch, PT, DPT   09/17/2020, 2:20 PM  [place] MAIN [facility] SERVICES [address] [place], [place], [address] Phone: [phone]   Fax:  [phone]

## 2020-12-21 ENCOUNTER — Ambulatory Visit (INDEPENDENT_AMBULATORY_CARE_PROVIDER_SITE_OTHER): Payer: No Typology Code available for payment source | Admitting: Dermatology

## 2020-12-21 ENCOUNTER — Other Ambulatory Visit: Payer: Self-pay

## 2020-12-21 DIAGNOSIS — L820 Inflamed seborrheic keratosis: Secondary | ICD-10-CM | POA: Diagnosis not present

## 2020-12-21 DIAGNOSIS — L821 Other seborrheic keratosis: Secondary | ICD-10-CM | POA: Diagnosis not present

## 2020-12-21 DIAGNOSIS — L578 Other skin changes due to chronic exposure to nonionizing radiation: Secondary | ICD-10-CM | POA: Diagnosis not present

## 2020-12-21 NOTE — Progress Notes (Signed)
   Follow-Up Visit   Subjective  Jaime Lynch is a 45 y.o. female who presents for the following: Follow-up (Patient here today for 4 month ISK follow up at the face. Three areas were treated with LN2 but have not resolved.).  The following portions of the chart were reviewed this encounter and updated as appropriate:   Tobacco  Allergies  Meds  Problems  Med Hx  Surg Hx  Fam Hx     Review of Systems:  No other skin or systemic complaints except as noted in HPI or Assessment and Plan.  Objective  Well appearing patient in no apparent distress; mood and affect are within normal limits.  A focused examination was performed including face. Relevant physical exam findings are noted in the Assessment and Plan.  Objective  Face x 3: Erythematous keratotic or waxy stuck-on papule or plaque.    Assessment & Plan  Inflamed seborrheic keratosis Face x 3  May need to add hydroquinone to help lighten once healed.   Destruction of lesion - Face x 3 Complexity: simple   Destruction method: cryotherapy   Informed consent: discussed and consent obtained   Timeout:  patient name, date of birth, surgical site, and procedure verified Lesion destroyed using liquid nitrogen: Yes   Region frozen until ice ball extended beyond lesion: Yes   Outcome: patient tolerated procedure well with no complications   Post-procedure details: wound care instructions given    Actinic Damage - chronic, secondary to cumulative UV radiation exposure/sun exposure over time - diffuse scaly erythematous macules with underlying dyspigmentation - Recommend daily broad spectrum sunscreen SPF 30+ to sun-exposed areas, reapply every 2 hours as needed.  - Call for new or changing lesions.  Seborrheic Keratoses - Stuck-on, waxy, tan-brown papules and plaques  - Discussed benign etiology and prognosis. - Observe - Call for any changes  Return in about 3 months (around 03/21/2021) for ISK at  face.  Graciella Belton, RMA, am acting as scribe for Sarina Ser, MD . Documentation: I have reviewed the above documentation for accuracy and completeness, and I agree with the above.  Sarina Ser, MD

## 2020-12-21 NOTE — Patient Instructions (Signed)
Cryotherapy Aftercare  . Wash gently with soap and water everyday.   Marland Kitchen Apply Vaseline and Band-Aid daily until healed.

## 2020-12-22 ENCOUNTER — Encounter: Payer: Self-pay | Admitting: Dermatology

## 2021-03-17 ENCOUNTER — Other Ambulatory Visit: Payer: Self-pay

## 2021-03-17 ENCOUNTER — Ambulatory Visit (INDEPENDENT_AMBULATORY_CARE_PROVIDER_SITE_OTHER): Payer: No Typology Code available for payment source | Admitting: Dermatology

## 2021-03-17 DIAGNOSIS — L578 Other skin changes due to chronic exposure to nonionizing radiation: Secondary | ICD-10-CM | POA: Diagnosis not present

## 2021-03-17 DIAGNOSIS — L821 Other seborrheic keratosis: Secondary | ICD-10-CM | POA: Diagnosis not present

## 2021-03-17 DIAGNOSIS — D225 Melanocytic nevi of trunk: Secondary | ICD-10-CM

## 2021-03-17 DIAGNOSIS — L820 Inflamed seborrheic keratosis: Secondary | ICD-10-CM

## 2021-03-17 NOTE — Progress Notes (Signed)
   Follow-Up Visit   Subjective  Jaime Lynch is a 45 y.o. female who presents for the following: Follow-up (3 months f/u ISK on the face ).  The following portions of the chart were reviewed this encounter and updated as appropriate:   Tobacco  Allergies  Meds  Problems  Med Hx  Surg Hx  Fam Hx     Review of Systems:  No other skin or systemic complaints except as noted in HPI or Assessment and Plan.  Objective  Well appearing patient in no apparent distress; mood and affect are within normal limits.  A focused examination was performed including face, back . Relevant physical exam findings are noted in the Assessment and Plan.  Objective  right temple x 2, right back x 1 (3): Erythematous keratotic or waxy stuck-on papule or plaque.   Assessment & Plan  Inflamed seborrheic keratosis (3) right temple x 2, right back x 1  May consider skin peels or skin lighters in the fall   Destruction of lesion - right temple x 2, right back x 1 Complexity: simple   Destruction method: cryotherapy   Informed consent: discussed and consent obtained   Timeout:  patient name, date of birth, surgical site, and procedure verified Lesion destroyed using liquid nitrogen: Yes   Region frozen until ice ball extended beyond lesion: Yes   Outcome: patient tolerated procedure well with no complications   Post-procedure details: wound care instructions given    Destruction of lesion - right temple x 2, right back x 1    Seborrheic Keratoses - Stuck-on, waxy, tan-brown papules and/or plaques  - Benign-appearing - Discussed benign etiology and prognosis. - Observe - Call for any changes  Actinic Damage - chronic, secondary to cumulative UV radiation exposure/sun exposure over time - diffuse scaly erythematous macules with underlying dyspigmentation - Recommend daily broad spectrum sunscreen SPF 30+ to sun-exposed areas, reapply every 2 hours as needed.  - Recommend staying in the  shade or wearing long sleeves, sun glasses (UVA+UVB protection) and wide brim hats (4-inch brim around the entire circumference of the hat). - Call for new or changing lesions.=  Melanocytic Nevi back - Tan-brown and/or pink-flesh-colored symmetric macules and papules - Benign appearing on exam today - Observation - Call clinic for new or changing moles - Recommend daily use of broad spectrum spf 30+ sunscreen to sun-exposed areas.   Return in about 7 months (around 10/17/2021) for ISK.  I, Marye Round, CMA, am acting as scribe for Sarina Ser, MD .  Documentation: I have reviewed the above documentation for accuracy and completeness, and I agree with the above.  Sarina Ser, MD

## 2021-03-17 NOTE — Patient Instructions (Addendum)
  Cryotherapy Aftercare  . Wash gently with soap and water everyday.   Marland Kitchen Apply Vaseline and Band-Aid daily until healed.    If you have any questions or concerns for your doctor, please call our main line at [phone] and press option 4 to reach your doctor's medical assistant. If no one answers, please leave a voicemail as directed and we will return your call as soon as possible. Messages left after 4 pm will be answered the following business day.   You may also send [place] a message via [place]. We typically respond to MyChart messages within 1-2 business days.  For prescription refills, please ask your pharmacy to contact our office. Our fax number is [phone].  If you have an urgent issue when the clinic is closed that cannot wait until the next business day, you can page your doctor at the number below.    Please note that while we do our best to be available for urgent issues outside of office hours, we are not available 24/7.   If you have an urgent issue and are unable to reach [place], you may choose to seek medical care at your doctor's office, retail clinic, urgent care center, or emergency room.  If you have a medical emergency, please immediately call 911 or go to the emergency department.  Pager Numbers  - Dr. Nehemiah Massed: [phone]  - Dr. Laurence Ferrari: [phone]  - Dr. Nicole Kindred: [phone]  In the event of inclement weather, please call our main line at [phone] for an update on the status of any delays or closures.  Dermatology Medication Tips: Please keep the boxes that topical medications come in in order to help keep track of the instructions about where and how to use these. Pharmacies typically print the medication instructions only on the boxes and not directly on the medication tubes.   If your medication is too expensive, please contact our office at [phone] option 4 or send [place] a message through [place].   We are unable to tell what your co-pay for  medications will be in advance as this is different depending on your insurance coverage. However, we may be able to find a substitute medication at lower cost or fill out paperwork to get insurance to cover a needed medication.   If a prior authorization is required to get your medication covered by your insurance company, please allow [place] 1-2 business days to complete this process.  Drug prices often vary depending on where the prescription is filled and some pharmacies may offer cheaper prices.  The website www.goodrx.com contains coupons for medications through different pharmacies. The prices here do not account for what the cost may be with help from insurance (it may be cheaper with your insurance), but the website can give you the price if you did not use any insurance.  - You can print the associated coupon and take it with your prescription to the pharmacy.  - You may also stop by our office during regular business hours and pick up a GoodRx coupon card.  - If you need your prescription sent electronically to a different pharmacy, notify our office through [facility] or by phone at [phone] option 4.

## 2021-03-21 ENCOUNTER — Encounter: Payer: Self-pay | Admitting: Dermatology

## 2021-08-16 ENCOUNTER — Other Ambulatory Visit: Payer: Self-pay | Admitting: Obstetrics and Gynecology

## 2021-08-16 DIAGNOSIS — Z1231 Encounter for screening mammogram for malignant neoplasm of breast: Secondary | ICD-10-CM

## 2021-08-27 ENCOUNTER — Other Ambulatory Visit: Payer: Self-pay

## 2021-08-27 ENCOUNTER — Ambulatory Visit
Admission: RE | Admit: 2021-08-27 | Discharge: 2021-08-27 | Disposition: A | Discharge status: Home / Self Care | Payer: No Typology Code available for payment source | Source: Ambulatory Visit | Attending: Obstetrics and Gynecology | Admitting: Obstetrics and Gynecology

## 2021-08-27 DIAGNOSIS — Z1231 Encounter for screening mammogram for malignant neoplasm of breast: Secondary | ICD-10-CM | POA: Diagnosis present

## 2021-10-13 ENCOUNTER — Other Ambulatory Visit: Payer: Self-pay

## 2021-10-13 ENCOUNTER — Ambulatory Visit (INDEPENDENT_AMBULATORY_CARE_PROVIDER_SITE_OTHER): Payer: No Typology Code available for payment source | Admitting: Dermatology

## 2021-10-13 DIAGNOSIS — L820 Inflamed seborrheic keratosis: Secondary | ICD-10-CM

## 2021-10-13 DIAGNOSIS — L578 Other skin changes due to chronic exposure to nonionizing radiation: Secondary | ICD-10-CM | POA: Diagnosis not present

## 2021-10-13 DIAGNOSIS — L814 Other melanin hyperpigmentation: Secondary | ICD-10-CM | POA: Diagnosis not present

## 2021-10-13 NOTE — Patient Instructions (Signed)

## 2021-10-13 NOTE — Progress Notes (Signed)
   Follow-Up Visit   Subjective  Jaime Lynch is a 45 y.o. female who presents for the following: ISK f/u (R temple, LN2 at last visit). She has several other spots she would like checked on the face.  There are some new spots.  She has history of sun exposure and is concerned about skin cancer.  The following portions of the chart were reviewed this encounter and updated as appropriate:   Tobacco  Allergies  Meds  Problems  Med Hx  Surg Hx  Fam Hx     Review of Systems:  No other skin or systemic complaints except as noted in HPI or Assessment and Plan.  Objective  Well appearing patient in no apparent distress; mood and affect are within normal limits.  A focused examination was performed including face. Relevant physical exam findings are noted in the Assessment and Plan.  R cheek x 1, L zygoma x 1, Total = 2 (2) Residual stuck on waxy pap with PIPA right cheek,  and stuck on waxy pap with erythema L zygoma        Assessment & Plan  Inflamed seborrheic keratosis R cheek x 1, L zygoma x 1, Total = 2  Residual ISK with PIPA R cheek, LN2 today, discussed hydroquinone in the future  Destruction of lesion - R cheek x 1, L zygoma x 1, Total = 2 Complexity: simple   Destruction method: cryotherapy   Informed consent: discussed and consent obtained   Timeout:  patient name, date of birth, surgical site, and procedure verified Lesion destroyed using liquid nitrogen: Yes   Region frozen until ice ball extended beyond lesion: Yes   Outcome: patient tolerated procedure well with no complications   Post-procedure details: wound care instructions given    Lentigines - Scattered tan macules - Due to sun exposure - Benign-appering, observe - Recommend daily broad spectrum sunscreen SPF 30+ to sun-exposed areas, reapply every 2 hours as needed. - Call for any changes  Actinic Damage - chronic, secondary to cumulative UV radiation exposure/sun exposure over time -  diffuse scaly erythematous macules with underlying dyspigmentation - Recommend daily broad spectrum sunscreen SPF 30+ to sun-exposed areas, reapply every 2 hours as needed.  - Recommend staying in the shade or wearing long sleeves, sun glasses (UVA+UVB protection) and wide brim hats (4-inch brim around the entire circumference of the hat). - Call for new or changing lesions.  Return if symptoms worsen or fail to improve.  I, Othelia Pulling, RMA, am acting as scribe for Sarina Ser, MD . Documentation: I have reviewed the above documentation for accuracy and completeness, and I agree with the above.  Sarina Ser, MD

## 2021-10-26 ENCOUNTER — Encounter: Payer: Self-pay | Admitting: Dermatology

## 2022-01-31 LAB — EXTERNAL GENERIC LAB PROCEDURE: COLOGUARD: NEGATIVE

## 2022-07-29 ENCOUNTER — Other Ambulatory Visit: Payer: Self-pay | Admitting: Obstetrics and Gynecology

## 2022-07-29 DIAGNOSIS — Z1231 Encounter for screening mammogram for malignant neoplasm of breast: Secondary | ICD-10-CM

## 2022-08-29 ENCOUNTER — Ambulatory Visit
Admission: RE | Admit: 2022-08-29 | Discharge: 2022-08-29 | Disposition: A | Discharge status: Home / Self Care | Payer: No Typology Code available for payment source | Source: Ambulatory Visit | Attending: Obstetrics and Gynecology | Admitting: Obstetrics and Gynecology

## 2022-08-29 DIAGNOSIS — Z1231 Encounter for screening mammogram for malignant neoplasm of breast: Secondary | ICD-10-CM | POA: Diagnosis present

## 2023-10-20 ENCOUNTER — Other Ambulatory Visit: Payer: Self-pay | Admitting: Obstetrics and Gynecology

## 2023-10-20 DIAGNOSIS — N644 Mastodynia: Secondary | ICD-10-CM

## 2023-11-03 ENCOUNTER — Ambulatory Visit
Admission: RE | Admit: 2023-11-03 | Discharge: 2023-11-03 | Disposition: A | Discharge status: Home / Self Care | Payer: No Typology Code available for payment source | Source: Ambulatory Visit | Attending: Obstetrics and Gynecology | Admitting: Obstetrics and Gynecology

## 2023-11-03 ENCOUNTER — Encounter: Payer: Self-pay | Admitting: Radiology

## 2023-11-03 DIAGNOSIS — N644 Mastodynia: Secondary | ICD-10-CM | POA: Diagnosis present
